# Patient Record
Sex: Female | Born: 1975 | Race: White | Hispanic: Yes | Marital: Single | Smoking: Never smoker
Health system: Southern US, Community
[De-identification: ages and names within clinical notes are randomized; demographics above are authoritative.]

## PROBLEM LIST (undated history)

## (undated) HISTORY — PX: OTHER SURGICAL HISTORY: SHX169

---

## 2016-01-03 ENCOUNTER — Encounter (HOSPITAL_COMMUNITY): Payer: Self-pay | Admitting: *Deleted

## 2016-01-03 ENCOUNTER — Emergency Department (HOSPITAL_COMMUNITY): Payer: Self-pay

## 2016-01-03 ENCOUNTER — Emergency Department (HOSPITAL_COMMUNITY): Payer: No Typology Code available for payment source

## 2016-01-03 ENCOUNTER — Emergency Department (HOSPITAL_COMMUNITY)
Admission: EM | Admit: 2016-01-03 | Discharge: 2016-01-03 | Disposition: A | Discharge status: Home / Self Care | Payer: Self-pay | Attending: Emergency Medicine | Admitting: Emergency Medicine

## 2016-01-03 DIAGNOSIS — S1081XA Abrasion of other specified part of neck, initial encounter: Secondary | ICD-10-CM | POA: Insufficient documentation

## 2016-01-03 DIAGNOSIS — Y9389 Activity, other specified: Secondary | ICD-10-CM | POA: Insufficient documentation

## 2016-01-03 DIAGNOSIS — R55 Syncope and collapse: Secondary | ICD-10-CM | POA: Insufficient documentation

## 2016-01-03 DIAGNOSIS — Z3202 Encounter for pregnancy test, result negative: Secondary | ICD-10-CM | POA: Insufficient documentation

## 2016-01-03 DIAGNOSIS — Y92410 Unspecified street and highway as the place of occurrence of the external cause: Secondary | ICD-10-CM | POA: Insufficient documentation

## 2016-01-03 DIAGNOSIS — Y998 Other external cause status: Secondary | ICD-10-CM | POA: Insufficient documentation

## 2016-01-03 DIAGNOSIS — S0081XA Abrasion of other part of head, initial encounter: Secondary | ICD-10-CM | POA: Insufficient documentation

## 2016-01-03 DIAGNOSIS — S298XXA Other specified injuries of thorax, initial encounter: Secondary | ICD-10-CM

## 2016-01-03 DIAGNOSIS — S79921A Unspecified injury of right thigh, initial encounter: Secondary | ICD-10-CM | POA: Insufficient documentation

## 2016-01-03 DIAGNOSIS — S3991XA Unspecified injury of abdomen, initial encounter: Secondary | ICD-10-CM | POA: Insufficient documentation

## 2016-01-03 DIAGNOSIS — W2211XA Striking against or struck by driver side automobile airbag, initial encounter: Secondary | ICD-10-CM

## 2016-01-03 DIAGNOSIS — Z880 Allergy status to penicillin: Secondary | ICD-10-CM | POA: Insufficient documentation

## 2016-01-03 DIAGNOSIS — S4991XA Unspecified injury of right shoulder and upper arm, initial encounter: Secondary | ICD-10-CM | POA: Insufficient documentation

## 2016-01-03 DIAGNOSIS — S29001A Unspecified injury of muscle and tendon of front wall of thorax, initial encounter: Secondary | ICD-10-CM | POA: Insufficient documentation

## 2016-01-03 LAB — CBC WITH DIFFERENTIAL/PLATELET
BASOS PCT: 0 %
Basophils Absolute: 0 10*3/uL (ref 0.0–0.1)
EOS ABS: 0.8 10*3/uL — AB (ref 0.0–0.7)
Eosinophils Relative: 4 %
HCT: 43.4 % (ref 36.0–46.0)
HEMOGLOBIN: 14.6 g/dL (ref 12.0–15.0)
LYMPHS ABS: 1.7 10*3/uL (ref 0.7–4.0)
Lymphocytes Relative: 9 %
MCH: 30.7 pg (ref 26.0–34.0)
MCHC: 33.6 g/dL (ref 30.0–36.0)
MCV: 91.2 fL (ref 78.0–100.0)
MONO ABS: 0.8 10*3/uL (ref 0.1–1.0)
MONOS PCT: 4 %
NEUTROS PCT: 83 %
Neutro Abs: 15.7 10*3/uL — ABNORMAL HIGH (ref 1.7–7.7)
Platelets: 285 10*3/uL (ref 150–400)
RBC: 4.76 MIL/uL (ref 3.87–5.11)
RDW: 13.1 % (ref 11.5–15.5)
WBC: 19 10*3/uL — ABNORMAL HIGH (ref 4.0–10.5)

## 2016-01-03 LAB — BASIC METABOLIC PANEL
Anion gap: 9 (ref 5–15)
BUN: 14 mg/dL (ref 6–20)
CALCIUM: 9.4 mg/dL (ref 8.9–10.3)
CHLORIDE: 105 mmol/L (ref 101–111)
CO2: 28 mmol/L (ref 22–32)
CREATININE: 0.73 mg/dL (ref 0.44–1.00)
GFR calc Af Amer: >60 mL/min (ref >60)
GFR calc non Af Amer: >60 mL/min (ref >60)
Glucose, Bld: 125 mg/dL — ABNORMAL HIGH (ref 65–99)
Potassium: 4.1 mmol/L (ref 3.5–5.1)
SODIUM: 142 mmol/L (ref 135–145)

## 2016-01-03 LAB — I-STAT BETA HCG BLOOD, ED (MC, WL, AP ONLY)

## 2016-01-03 MED ORDER — TRAMADOL HCL 50 MG PO TABS: 50.0000 mg | ORAL_TABLET | Freq: Four times a day (QID) | ORAL | Status: AC | PRN

## 2016-01-03 MED ORDER — IOHEXOL 300 MG/ML  SOLN
80.0000 mL | Freq: Once | INTRAMUSCULAR | Status: AC | PRN
  Administered 2016-01-03: 100 mL via INTRAVENOUS

## 2016-01-03 MED ORDER — ONDANSETRON HCL 4 MG/2ML IJ SOLN
4.0000 mg | Freq: Once | INTRAMUSCULAR | Status: AC
  Administered 2016-01-03: 4 mg via INTRAVENOUS
  Filled 2016-01-03: qty 2

## 2016-01-03 MED ORDER — MORPHINE SULFATE (PF) 4 MG/ML IV SOLN
4.0000 mg | Freq: Once | INTRAVENOUS | Status: AC
  Administered 2016-01-03: 4 mg via INTRAVENOUS
  Filled 2016-01-03: qty 1

## 2016-01-03 MED ORDER — BACITRACIN ZINC 500 UNIT/GM EX OINT
TOPICAL_OINTMENT | Freq: Two times a day (BID) | CUTANEOUS | Status: DC
  Administered 2016-01-03: 1 via TOPICAL

## 2016-01-03 MED ORDER — SODIUM CHLORIDE 0.9 % IV BOLUS (SEPSIS)
1000.0000 mL | Freq: Once | INTRAVENOUS | Status: AC
  Administered 2016-01-03: 1000 mL via INTRAVENOUS

## 2016-01-03 MED ORDER — BACITRACIN ZINC 500 UNIT/GM EX OINT: 1.0000 "application " | TOPICAL_OINTMENT | Freq: Two times a day (BID) | CUTANEOUS | Status: AC
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
## 2016-01-03 NOTE — Discharge Instructions (Signed)
Traumatismo abdominal cerrado (Blunt Abdominal Trauma) El traumatismo abdominal cerrado es un tipo de lesin que causa daos en la pared abdominal o en los rganos del abdomen, como el hgado o el bazo. El dao puede incluir hematomas, desgarros o una rotura. Este tipo de lesin no causa heridas punzantes en la piel. El traumatismo abdominal cerrado puede ser leve o grave. En algunos casos, puede producir inflamacin abdominal grave (peritonitis), hemorragia abundante y un descenso peligroso de la presin arterial. CAUSAS La causa de esta lesin es un golpe fuerte y directo en el abdomen. Puede ocurrir despus de lo siguiente:  Sufrir un accidente automovilstico.  Recibir una patada o un puetazo en el abdomen.  Sufrir una cada desde una altura importante. FACTORES DE RIESGO Es ms probable que esta lesin se produzca en las personas que:  Practican deportes de Pharmacologist.  Tienen un trabajo en el que las cadas o las lesiones son ms probables, por ejemplo, en la construccin. SNTOMAS El sntoma principal de esta afeccin es el dolor abdominal. Los otros sntomas dependen del tipo y la ubicacin de la lesin. Pueden incluir los siguientes:  Dolor abdominal que se extiende a la espalda o el hombro.  Hematomas.  Hinchazn.  Dolor al ejercer presin sobre el abdomen.  Sangre en la orina.  Debilidad.  Confusin.  Prdida de la conciencia.  Piel plida, de color oscuro, fra o sudorosa.  Vmitos de [place].  Heces con sangre o sangrado rectal.  Problemas respiratorios. Los sntomas de la lesin pueden aparecer de manera repentina o lenta.  DIAGNSTICO El diagnstico de la lesin se basa en los sntomas y el examen fsico. Tambin pueden hacerle exmenes, que incluyen los siguientes:  Anlisis de [place].  Anlisis de [place].  Pruebas de diagnstico por imgenes, como:  Tomografa computarizada (TC) y ecografa de abdomen.  Radiografas de trax y de abdomen.  Un  estudio en el que se [place] una sonda para Automotive engineer lquido en el abdomen y Engineer, manufacturing la presencia de sangre (lavado peritoneal diagnstico). TRATAMIENTO El tratamiento de esta lesin depende de su tipo y [place]. Entre las otras opciones de [place], se incluyen las siguientes:  Observacin. Si la lesin es leve, tal vez este sea el nico tratamiento necesario.  Asistencia respiratoria y para la presin arterial.  Administracin de sangre, lquidos o medicamentos por va intravenosa (IV).  Antibiticos.  Insercin de sondas en el estmago o la vejiga.  Transfusin de [place].  Un procedimiento para detener la hemorragia, que requiere la colocacin de un tubo largo y delgado (catter) en uno de los vasos sanguneos (embolizacin angiogrfica).  Ciruga para abrir el abdomen y Scientist, physiological hemorragia o reparar el dao (laparotoma). Esta puede realizarse si los estudios sugieren la presencia de peritonitis o hemorragia que no pueden controlarse con Psychiatric nurse. INSTRUCCIONES PARA EL CUIDADO EN EL HOGAR  Tome los medicamentos solamente como se lo haya indicado el mdico.  Si le recetaron antibiticos, asegrese de terminarlos, incluso si comienza a sentirse mejor.  Siga las indicaciones del mdico con respecto a las Financial trader dieta y la [place].  Concurra a todas las visitas de control como se lo haya indicado el mdico. Esto es importante. SOLICITE ATENCIN MDICA SI:  Sigue teniendo dolor abdominal.  Los sntomas vuelven a Research officer, trade union.  Presenta nuevos sntomas.  Observa sangre en la orina o en las heces. SOLICITE ATENCIN MDICA DE INMEDIATO SI:  Vomita sangre.  Tiene hemorragia rectal abundante.  Siente un dolor abdominal muy intenso.  Tiene  dificultad para respirar.  Siente dolor en el pecho.  Tiene fiebre.  Tiene mareos.  Se desmaya.   Esta informacin no tiene Theme park manager el consejo del mdico. Asegrese de hacerle al mdico  cualquier pregunta que tenga.   Document Released: 11/20/2005 Document Revised: 04/06/2015 Elsevier Interactive Patient Education 2016 [employer].  Traumatismo Torcico Contuso (Blunt Chest Trauma)  El traumatismo torcico contuso es una lesin causada por un golpe en el pecho. Estas lesiones suelen ser muy dolorosas. Generalmente resulta en un hematoma o en costillas rotas (fracturadas). La mayor parte de los hematomas y las fracturas de costillas por traumatismos torcicos contusos mejoran despus de 1 a 3 semanas de reposo y uso de medicamentos para Chief Technology Officer. Generalmente, los tejidos blandos de la pared torcica tambin se lesionan, lo que produce dolor y hematomas. Los rganos internos, como el corazn y los pulmones, tambin pueden sufrir lesiones. El traumatismo torcico contuso puede producir problemas mdicos graves. Este tipo de lesin requiere atencin mdica inmediata.  CAUSAS   Colisiones en vehculos de motor.  Cadas.  Violencia fsica.  Lesiones deportivas. SNTOMAS   Dolor en el pecho. El dolor puede empeorar al moverse o respirar profundamente.  Falta de aire.  Aturdimiento.  Hematomas.  Sensibilidad.  Hinchazn. DIAGNSTICO  El mdico le har un examen fsico. Podrn tomarle radiografas para comprobar si hay fracturas. Sin embargo, las fracturas pequeas en las costillas pueden no aparecer en las radiografas hasta unos das despus de la lesin. Si se sospecha una lesin ms grave, podrn indicarle otras pruebas de diagnstico por imgenes. Puede incluir ecografas, tomografa computada (TC) o resonancia magntica (IMR).  TRATAMIENTO  El tratamiento depende de la gravedad de la lesin. El mdico podr recetarle medicamentos para Primary school teacher e indicarle ejercicios de respiracin profunda.  INSTRUCCIONES PARA EL CUIDADO EN EL HOGAR   Limite sus actividades hasta que pueda moverse sin sentir Scientist, forensic.  No realice trabajos extenuantes hasta que la  lesin se haya curado.  Aplique hielo sobre la zona lesionada.  Ponga el hielo en una bolsa plstica.  Colquese una toalla entre la piel y la bolsa de hielo.  Deje el hielo durante 15 a 20 minutos, 3 a 4 veces por da.  Podr utilizar una faja para las costillas para reducir Chief Technology Officer segn le hayan indicado.  Realice inspiraciones, profundas segn las indicaciones del mdico, para mantener los pulmones limpios.  Slo tome medicamentos de venta libre o recetados para Primary school teacher, la fiebre, o el [place], segn las indicaciones de su mdico. SOLICITE ATENCIN MDICA DE [employer] SI:  Siente falta de aire o dolor en el pecho cada vez ms intensos.  Tose y escupe sangre.  Tiene nuseas, vmitos o dolor abdominal.  Tiene fiebre.  Se siente mareado, dbil o se desmaya. ASEGRESE DE QUE:   Comprende estas instrucciones.  Controlar su enfermedad.  Solicitar ayuda de inmediato si no mejora o si empeora.   Esta informacin no tiene Theme park manager el consejo del mdico. Asegrese de hacerle al mdico cualquier pregunta que tenga.   Document Released: 11/20/2005 Document Revised: 02/12/2012 Elsevier Interactive Patient Education [employer] Burn Care Your skin is a natural barrier to infection. It is the largest organ of your body. Burns damage this natural protection. To help prevent infection, it is very important to follow your caregiver's instructions in the care of your burn. Burns are classified as:  First degree. There is only redness of the skin (erythema). No scarring is  expected.  Second degree. There is blistering of the skin. Scarring may occur with deeper burns.  Third degree. All layers of the skin are injured, and scarring is expected. HOME CARE INSTRUCTIONS   Wash your hands well before changing your bandage.  Change your bandage as often as directed by your caregiver.  Remove the old bandage. If the bandage sticks, you may soak it off  with cool, clean water.  Cleanse the burn thoroughly but gently with mild soap and water.  Pat the area dry with a clean, dry cloth.  Apply a thin layer of antibacterial cream to the burn.  Apply a clean bandage as instructed by your caregiver.  Keep the bandage as clean and dry as possible.  Elevate the affected area for the first 24 hours, then as instructed by your caregiver.  Only take over-the-counter or prescription medicines for pain, discomfort, or fever as directed by your caregiver. SEEK IMMEDIATE MEDICAL CARE IF:   You develop excessive pain.  You develop redness, tenderness, swelling, or red streaks near the burn.  The burned area develops yellowish-white fluid (pus) or a bad smell.  You have a fever. MAKE SURE YOU:   Understand these instructions.  Will watch your condition.  Will get help right away if you are not doing well or get worse.   This information is not intended to replace advice given to you by your health care provider. Make sure you discuss any questions you have with your health care provider.   Document Released: 11/20/2005 Document Revised: 02/12/2012 Document Reviewed: 04/12/2011 Elsevier Interactive Patient Education [employer]

## 2016-01-03 NOTE — ED Notes (Signed)
Pt A&OX4, ambulatory at d/c with steady gait, NAD and states she has all of her belongings with her at d/c 
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
## 2024-07-08 ENCOUNTER — Encounter: Payer: Self-pay | Admitting: Internal Medicine

## 2024-07-08 ENCOUNTER — Ambulatory Visit (INDEPENDENT_AMBULATORY_CARE_PROVIDER_SITE_OTHER): Payer: Self-pay | Admitting: Internal Medicine

## 2024-07-08 VITALS — BP 120/80 | HR 73 | Temp 98.2°F | Ht 60.0 in | Wt 137.3 lb

## 2024-07-08 DIAGNOSIS — Z114 Encounter for screening for human immunodeficiency virus [HIV]: Secondary | ICD-10-CM

## 2024-07-08 DIAGNOSIS — Z0000 Encounter for general adult medical examination without abnormal findings: Secondary | ICD-10-CM

## 2024-07-08 DIAGNOSIS — Z1159 Encounter for screening for other viral diseases: Secondary | ICD-10-CM

## 2024-07-08 DIAGNOSIS — Z23 Encounter for immunization: Secondary | ICD-10-CM

## 2024-07-08 DIAGNOSIS — Z124 Encounter for screening for malignant neoplasm of cervix: Secondary | ICD-10-CM

## 2024-07-08 DIAGNOSIS — Z1211 Encounter for screening for malignant neoplasm of colon: Secondary | ICD-10-CM

## 2024-07-08 DIAGNOSIS — Z131 Encounter for screening for diabetes mellitus: Secondary | ICD-10-CM

## 2024-07-08 DIAGNOSIS — Z13220 Encounter for screening for lipoid disorders: Secondary | ICD-10-CM

## 2024-07-08 DIAGNOSIS — H539 Unspecified visual disturbance: Secondary | ICD-10-CM

## 2024-07-08 DIAGNOSIS — Z1231 Encounter for screening mammogram for malignant neoplasm of breast: Secondary | ICD-10-CM

## 2024-07-08 LAB — COMPREHENSIVE METABOLIC PANEL WITH GFR
ALT: 34 U/L (ref 0–35)
AST: 39 U/L — ABNORMAL HIGH (ref 0–37)
Albumin: 4.2 g/dL (ref 3.5–5.2)
Alkaline Phosphatase: 79 U/L (ref 39–117)
BUN: 12 mg/dL (ref 6–23)
CO2: 31 meq/L (ref 19–32)
Calcium: 9.1 mg/dL (ref 8.4–10.5)
Chloride: 101 meq/L (ref 96–112)
Creatinine, Ser: 0.61 mg/dL (ref 0.40–1.20)
GFR: 106 mL/min (ref >60.00)
Glucose, Bld: 105 mg/dL — ABNORMAL HIGH (ref 70–99)
Potassium: 3.3 meq/L — ABNORMAL LOW (ref 3.5–5.1)
Sodium: 138 meq/L (ref 135–145)
Total Bilirubin: 0.4 mg/dL (ref 0.2–1.2)
Total Protein: 8 g/dL (ref 6.0–8.3)

## 2024-07-08 LAB — CBC WITH DIFFERENTIAL/PLATELET
Basophils Absolute: 0 K/uL (ref 0.0–0.1)
Basophils Relative: 0.5 % (ref 0.0–3.0)
Eosinophils Absolute: 0.3 K/uL (ref 0.0–0.7)
Eosinophils Relative: 4.1 % (ref 0.0–5.0)
HCT: 41.6 % (ref 36.0–46.0)
Hemoglobin: 13.5 g/dL (ref 12.0–15.0)
Lymphocytes Relative: 24.8 % (ref 12.0–46.0)
Lymphs Abs: 1.6 K/uL (ref 0.7–4.0)
MCHC: 32.5 g/dL (ref 30.0–36.0)
MCV: 88.8 fl (ref 78.0–100.0)
Monocytes Absolute: 0.7 K/uL (ref 0.1–1.0)
Monocytes Relative: 11.3 % (ref 3.0–12.0)
Neutro Abs: 3.7 K/uL (ref 1.4–7.7)
Neutrophils Relative %: 59.3 % (ref 43.0–77.0)
Platelets: 220 K/uL (ref 150.0–400.0)
RBC: 4.68 Mil/uL (ref 3.87–5.11)
RDW: 13.5 % (ref 11.5–15.5)
WBC: 6.3 K/uL (ref 4.0–10.5)

## 2024-07-08 LAB — LIPID PANEL
Cholesterol: 118 mg/dL (ref 0–200)
HDL: 39 mg/dL — ABNORMAL LOW (ref >39.00)
LDL Cholesterol: 65 mg/dL (ref 0–99)
NonHDL: 79.4
Total CHOL/HDL Ratio: 3
Triglycerides: 70 mg/dL (ref 0.0–149.0)
VLDL: 14 mg/dL (ref 0.0–40.0)

## 2024-07-08 LAB — HEMOGLOBIN A1C: Hgb A1c MFr Bld: 7.1 % — ABNORMAL HIGH (ref 4.6–6.5)

## 2024-07-08 NOTE — Progress Notes (Signed)
 New Patient Office Visit     CC/Reason for Visit: Establish care, annual preventive exam Previous PCP: None Last Visit: Unknown  HPI: Jamie Barrett is a 48 y.o. female who is coming in today for the above mentioned reasons.  She has no past medical history of significance but also has not had routine medical care.  Was prescribed metformin many years ago so unclear if she might be a diabetic or if it was used for insulin resistance.  She does not smoke or drink, no allergies, has had a left ankle surgery, her mother had coronary artery disease.  She is overdue for an eye exam, overdue for Tdap at all age-appropriate cancer screening.   Past Medical/Surgical History: History reviewed. No pertinent past medical history.  Past Surgical History:  Procedure Laterality Date   left ankle orif      Social History:  reports that she has never smoked. She does not have any smokeless tobacco history on file. She reports that she does not drink alcohol and does not use drugs.  Allergies: Allergies  Allergen Reactions   Penicillins Hives    Family History:  Family History  Problem Relation Age of Onset   CAD Mother      Current Outpatient Medications:    bacitracin  ointment, Apply 1 application topically 2 (two) times daily., Disp: 120 g, Rfl: 0   traMADol  (ULTRAM ) 50 MG tablet, Take 1 tablet (50 mg total) by mouth every 6 (six) hours as needed., Disp: 15 tablet, Rfl: 0  Review of Systems:  Negative except as indicated in HPI.   Physical Exam: Vitals:   07/08/24 1250  BP: 120/80  Pulse: 73  Temp: 98.2 F (36.8 C)  TempSrc: Oral  SpO2: 97%  Weight: 137 lb 4.8 oz (62.3 kg)  Height: 5' (1.524 m)   Body mass index is 26.81 kg/m.  Physical Exam Vitals reviewed.  Constitutional:      General: She is not in acute distress.    Appearance: Normal appearance. She is not ill-appearing, toxic-appearing or diaphoretic.  HENT:     Head: Normocephalic.     Right Ear:  Tympanic membrane, ear canal and external ear normal. There is no impacted cerumen.     Left Ear: Tympanic membrane, ear canal and external ear normal. There is no impacted cerumen.     Nose: Nose normal.     Mouth/Throat:     Mouth: Mucous membranes are moist.     Pharynx: Oropharynx is clear. No oropharyngeal exudate or posterior oropharyngeal erythema.  Eyes:     General: No scleral icterus.       Right eye: No discharge.        Left eye: No discharge.     Conjunctiva/sclera: Conjunctivae normal.     Pupils: Pupils are equal, round, and reactive to light.  Neck:     Vascular: No carotid bruit.  Cardiovascular:     Rate and Rhythm: Normal rate and regular rhythm.     Pulses: Normal pulses.     Heart sounds: Normal heart sounds.  Pulmonary:     Effort: Pulmonary effort is normal. No respiratory distress.     Breath sounds: Normal breath sounds.  Abdominal:     General: Abdomen is flat. Bowel sounds are normal.     Palpations: Abdomen is soft.  Musculoskeletal:        General: No signs of injury. Normal range of motion.     Cervical back: Normal range of motion.  Skin:    General: Skin is warm and dry.  Neurological:     General: No focal deficit present.     Mental Status: She is alert and oriented to person, place, and time. Mental status is at baseline.  Psychiatric:        Mood and Affect: Mood normal.        Behavior: Behavior normal.        Thought Content: Thought content normal.        Judgment: Judgment normal.        Impression and Plan:  Encounter for preventive health examination -     CBC with Differential/Platelet; Future -     Comprehensive metabolic panel with GFR; Future -     Hemoglobin A1c; Future -     Lipid panel; Future  Immunization due  Screening mammogram for breast cancer  Screening for cervical cancer  Screening for colon cancer  Encounter for hepatitis C screening test for low risk patient -     Hepatitis C antibody;  Future  Encounter for screening for HIV -     HIV Antibody (routine testing w rflx); Future    -Recommend routine eye and dental care. -Healthy lifestyle discussed in detail. -Labs to be updated today. -Prostate cancer screening: Not applicable Health Maintenance  Topic Date Due   HIV Screening  Never done   Hepatitis C Screening  Never done   DTaP/Tdap/Td vaccine (1 - Tdap) Never done   Hepatitis B Vaccine (1 of 3 - 19+ 3-dose series) Never done   Pap with HPV screening  Never done   Colon Cancer Screening  Never done   COVID-19 Vaccine (1 - 2024-25 season) Never done   Flu Shot  07/04/2024   HPV Vaccine  Aged Out   Meningitis B Vaccine  Aged Out     -Tdap administered in office today. - Will place referrals for GI, GYN, mammogram and an eye exam.     Iasiah Ozment Theophilus Andrews, MD [facility]

## 2024-07-08 NOTE — Addendum Note (Signed)
 Addended by: KATHRYNE MILLMAN B on: 07/08/2024 01:25 PM   Modules accepted: Orders

## 2024-07-09 LAB — HEPATITIS C ANTIBODY: Hepatitis C Ab: NONREACTIVE

## 2024-07-09 LAB — HIV ANTIBODY (ROUTINE TESTING W REFLEX): HIV 1&2 Ab, 4th Generation: NONREACTIVE

## 2024-07-10 ENCOUNTER — Ambulatory Visit: Payer: Self-pay | Admitting: Adult Health

## 2024-07-23 MED ORDER — METFORMIN HCL ER 500 MG PO TB24: 500.0000 mg | ORAL_TABLET | Freq: Every day | ORAL | 1 refills | Status: AC

## 2024-08-25 ENCOUNTER — Encounter: Payer: Self-pay | Admitting: Internal Medicine

## 2024-10-28 ENCOUNTER — Ambulatory Visit: Payer: Self-pay | Admitting: Internal Medicine
